# Patient Record
Sex: Male | Born: 2000 | Race: White | Hispanic: No | Marital: Single | Smoking: Current every day smoker
Health system: Southern US, Community
[De-identification: ages and names within clinical notes are randomized; demographics above are authoritative.]

## PROBLEM LIST (undated history)

## (undated) ENCOUNTER — Ambulatory Visit (HOSPITAL_COMMUNITY): Discharge status: Absent without leave | Payer: Self-pay

## (undated) DIAGNOSIS — J45909 Unspecified asthma, uncomplicated: Secondary | ICD-10-CM

## (undated) DIAGNOSIS — F909 Attention-deficit hyperactivity disorder, unspecified type: Secondary | ICD-10-CM

---
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
